# Patient Record
Sex: Female | Born: 1960 | Race: White | Hispanic: Yes
Health system: Southern US, Community
[De-identification: ages and names within clinical notes are randomized; demographics above are authoritative.]

---

## 2019-04-26 ENCOUNTER — Telehealth: Payer: Self-pay | Admitting: Hematology

## 2019-04-26 NOTE — Telephone Encounter (Signed)
Colleen Reeves has been cld and scheduled to see Dr. Irene Limbo on 2/15 at 1pm. She's aware to arrive 15 minutes early.

## 2019-05-07 ENCOUNTER — Inpatient Hospital Stay: Payer: Federal, State, Local not specified - PPO

## 2019-05-07 ENCOUNTER — Ambulatory Visit (HOSPITAL_COMMUNITY)
Admission: RE | Admit: 2019-05-07 | Discharge: 2019-05-07 | Disposition: A | Discharge status: Home / Self Care | Payer: Federal, State, Local not specified - PPO | Source: Ambulatory Visit | Attending: Hematology | Admitting: Hematology

## 2019-05-07 ENCOUNTER — Other Ambulatory Visit: Payer: Self-pay

## 2019-05-07 ENCOUNTER — Inpatient Hospital Stay: Payer: Federal, State, Local not specified - PPO | Attending: Hematology | Admitting: Hematology

## 2019-05-07 VITALS — BP 143/74 | HR 62 | Temp 97.6°F | Resp 18 | Ht 68.0 in | Wt 180.7 lb

## 2019-05-07 DIAGNOSIS — E039 Hypothyroidism, unspecified: Secondary | ICD-10-CM | POA: Insufficient documentation

## 2019-05-07 DIAGNOSIS — Z95828 Presence of other vascular implants and grafts: Secondary | ICD-10-CM

## 2019-05-07 DIAGNOSIS — C8233 Follicular lymphoma grade IIIa, intra-abdominal lymph nodes: Secondary | ICD-10-CM | POA: Diagnosis present

## 2019-05-07 DIAGNOSIS — C8290 Follicular lymphoma, unspecified, unspecified site: Secondary | ICD-10-CM | POA: Diagnosis present

## 2019-05-07 DIAGNOSIS — Z720 Tobacco use: Secondary | ICD-10-CM | POA: Diagnosis not present

## 2019-05-07 DIAGNOSIS — Z9221 Personal history of antineoplastic chemotherapy: Secondary | ICD-10-CM | POA: Diagnosis not present

## 2019-05-07 DIAGNOSIS — Z90710 Acquired absence of both cervix and uterus: Secondary | ICD-10-CM | POA: Insufficient documentation

## 2019-05-07 LAB — CMP (CANCER CENTER ONLY)
ALT: 12 U/L (ref 0–44)
AST: 13 U/L — ABNORMAL LOW (ref 15–41)
Albumin: 3.9 g/dL (ref 3.5–5.0)
Alkaline Phosphatase: 83 U/L (ref 38–126)
Anion gap: 9 (ref 5–15)
BUN: 16 mg/dL (ref 6–20)
CO2: 26 mmol/L (ref 22–32)
Calcium: 8.7 mg/dL — ABNORMAL LOW (ref 8.9–10.3)
Chloride: 108 mmol/L (ref 98–111)
Creatinine: 0.67 mg/dL (ref 0.44–1.00)
GFR, Est AFR Am: >60 mL/min (ref >60)
GFR, Estimated: >60 mL/min (ref >60)
Glucose, Bld: 96 mg/dL (ref 70–99)
Potassium: 3.7 mmol/L (ref 3.5–5.1)
Sodium: 143 mmol/L (ref 135–145)
Total Bilirubin: 0.3 mg/dL (ref 0.3–1.2)
Total Protein: 6.4 g/dL — ABNORMAL LOW (ref 6.5–8.1)

## 2019-05-07 LAB — CBC WITH DIFFERENTIAL/PLATELET
Abs Immature Granulocytes: 0.01 10*3/uL (ref 0.00–0.07)
Basophils Absolute: 0 10*3/uL (ref 0.0–0.1)
Basophils Relative: 1 %
Eosinophils Absolute: 0.1 10*3/uL (ref 0.0–0.5)
Eosinophils Relative: 2 %
HCT: 36 % (ref 36.0–46.0)
Hemoglobin: 12.2 g/dL (ref 12.0–15.0)
Immature Granulocytes: 0 %
Lymphocytes Relative: 36 %
Lymphs Abs: 1.8 10*3/uL (ref 0.7–4.0)
MCH: 32.4 pg (ref 26.0–34.0)
MCHC: 33.9 g/dL (ref 30.0–36.0)
MCV: 95.7 fL (ref 80.0–100.0)
Monocytes Absolute: 0.4 10*3/uL (ref 0.1–1.0)
Monocytes Relative: 8 %
Neutro Abs: 2.8 10*3/uL (ref 1.7–7.7)
Neutrophils Relative %: 53 %
Platelets: 165 10*3/uL (ref 150–400)
RBC: 3.76 MIL/uL — ABNORMAL LOW (ref 3.87–5.11)
RDW: 12.1 % (ref 11.5–15.5)
WBC: 5.1 10*3/uL (ref 4.0–10.5)
nRBC: 0 % (ref 0.0–0.2)

## 2019-05-07 LAB — LACTATE DEHYDROGENASE: LDH: 143 U/L (ref 98–192)

## 2019-05-07 MED ORDER — HEPARIN SOD (PORK) LOCK FLUSH 100 UNIT/ML IV SOLN
500.0000 [IU] | Freq: Once | INTRAVENOUS | Status: AC
  Administered 2019-05-07: 500 [IU] via INTRAVENOUS
  Filled 2019-05-07: qty 5

## 2019-05-07 MED ORDER — SODIUM CHLORIDE 0.9% FLUSH
10.0000 mL | Freq: Once | INTRAVENOUS | Status: AC
  Administered 2019-05-07: 10 mL via INTRAVENOUS
  Filled 2019-05-07: qty 10
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
## 2019-05-07 NOTE — Patient Instructions (Signed)

## 2019-05-07 NOTE — Patient Instructions (Signed)
Thank you for choosing [facility] to provide your oncology and hematology care.   Should you have questions after your visit to the [facility] ([facility]), please contact this office at [phone] between 8:30 AM and 4:30 PM.  Voice mails left after 4:00 PM may not be returned until the following business day.  Calls received after 4:30 PM will be answered by an off-site Nurse Triage Line.    Prescription Refills:  Please have your pharmacy contact us directly for most prescription requests.  Contact the office directly for refills of narcotics (pain medications). Allow 48-72 hours for refills.  Appointments: Please contact the [facility] scheduling department [phone] for questions regarding [facility] appointment scheduling.  Contact the schedulers with any scheduling changes so that your appointment can be rescheduled in a timely manner.   Central Scheduling for [place] [phone] - Call to schedule procedures such as PET scans, CT scans, MRI, Ultrasound, etc.  To afford each patient quality time with our providers, please arrive 30 minutes before your scheduled appointment time.  If you arrive late for your appointment, you may be asked to reschedule.  We strive to give you quality time with our providers, and arriving late affects you and other patients whose appointments are after yours. If you are a no show for multiple scheduled visits, you may be dismissed from the clinic at the providers discretion.     Resources: [facility] Social Workers [phone] for additional information on assistance programs or assistance connecting with community support programs   [place] DSS  336-641-3447: Information regarding food stamps, Medicaid, and utility assistance [place] [phone]   [place] [employer]'s shared-ride transportation service for eligible riders who have a disability that prevents them from riding the fixed route bus.   Medicare Rights Center  800-333-4114 Helps people with Medicare understand their rights and benefits, navigate the Medicare system, and secure the quality healthcare they deserve [employer] 800-227-2345 Assists patients locate various types of support and financial assistance Cancer Care: 1-800-813-HOPE (4673) Provides financial assistance, online support groups, medication/co-pay assistance.   Transportation Assistance for appointments at [facility]: Transportation Coordinator [phone]  Again, thank you for choosing [facility] for your care.       

## 2019-05-08 LAB — TSH: TSH: 0.553 u[IU]/mL (ref 0.308–3.960)

## 2019-08-01 ENCOUNTER — Telehealth: Payer: Self-pay | Admitting: *Deleted

## 2019-08-01 NOTE — Telephone Encounter (Signed)
Patient called - needs to r/s appt on August 8/16 with Dr.Kale. She will be out of town Scheduled message sent. Appts on 8/16 cancelled

## 2019-10-22 ENCOUNTER — Other Ambulatory Visit: Payer: Self-pay

## 2019-10-22 ENCOUNTER — Inpatient Hospital Stay: Payer: Federal, State, Local not specified - PPO

## 2019-10-22 ENCOUNTER — Other Ambulatory Visit: Payer: Self-pay | Admitting: Hematology

## 2019-10-22 ENCOUNTER — Inpatient Hospital Stay: Payer: Federal, State, Local not specified - PPO | Attending: Hematology

## 2019-10-22 DIAGNOSIS — Z8572 Personal history of non-Hodgkin lymphomas: Secondary | ICD-10-CM | POA: Diagnosis not present

## 2019-10-22 DIAGNOSIS — C8290 Follicular lymphoma, unspecified, unspecified site: Secondary | ICD-10-CM

## 2019-10-22 DIAGNOSIS — Z95828 Presence of other vascular implants and grafts: Secondary | ICD-10-CM

## 2019-10-22 LAB — CMP (CANCER CENTER ONLY)
ALT: 11 U/L (ref 0–44)
AST: 12 U/L — ABNORMAL LOW (ref 15–41)
Albumin: 4 g/dL (ref 3.5–5.0)
Alkaline Phosphatase: 77 U/L (ref 38–126)
Anion gap: 9 (ref 5–15)
BUN: 19 mg/dL (ref 6–20)
CO2: 23 mmol/L (ref 22–32)
Calcium: 10 mg/dL (ref 8.9–10.3)
Chloride: 109 mmol/L (ref 98–111)
Creatinine: 0.69 mg/dL (ref 0.44–1.00)
GFR, Est AFR Am: >60 mL/min (ref >60)
GFR, Estimated: >60 mL/min (ref >60)
Glucose, Bld: 95 mg/dL (ref 70–99)
Potassium: 3.3 mmol/L — ABNORMAL LOW (ref 3.5–5.1)
Sodium: 141 mmol/L (ref 135–145)
Total Bilirubin: 0.4 mg/dL (ref 0.3–1.2)
Total Protein: 6.7 g/dL (ref 6.5–8.1)

## 2019-10-22 LAB — CBC WITH DIFFERENTIAL/PLATELET
Abs Immature Granulocytes: 0.01 10*3/uL (ref 0.00–0.07)
Basophils Absolute: 0 10*3/uL (ref 0.0–0.1)
Basophils Relative: 1 %
Eosinophils Absolute: 0 10*3/uL (ref 0.0–0.5)
Eosinophils Relative: 1 %
HCT: 35.9 % — ABNORMAL LOW (ref 36.0–46.0)
Hemoglobin: 12.2 g/dL (ref 12.0–15.0)
Immature Granulocytes: 0 %
Lymphocytes Relative: 30 %
Lymphs Abs: 1.2 10*3/uL (ref 0.7–4.0)
MCH: 32 pg (ref 26.0–34.0)
MCHC: 34 g/dL (ref 30.0–36.0)
MCV: 94.2 fL (ref 80.0–100.0)
Monocytes Absolute: 0.4 10*3/uL (ref 0.1–1.0)
Monocytes Relative: 9 %
Neutro Abs: 2.4 10*3/uL (ref 1.7–7.7)
Neutrophils Relative %: 59 %
Platelets: 147 10*3/uL — ABNORMAL LOW (ref 150–400)
RBC: 3.81 MIL/uL — ABNORMAL LOW (ref 3.87–5.11)
RDW: 12 % (ref 11.5–15.5)
WBC: 4.1 10*3/uL (ref 4.0–10.5)
nRBC: 0 % (ref 0.0–0.2)

## 2019-10-22 LAB — LACTATE DEHYDROGENASE: LDH: 157 U/L (ref 98–192)

## 2019-10-22 MED ORDER — SODIUM CHLORIDE 0.9% FLUSH
10.0000 mL | INTRAVENOUS | Status: DC | PRN
  Administered 2019-10-22: 10 mL via INTRAVENOUS
  Filled 2019-10-22: qty 10

## 2019-10-22 MED ORDER — HEPARIN SOD (PORK) LOCK FLUSH 100 UNIT/ML IV SOLN
500.0000 [IU] | Freq: Once | INTRAVENOUS | Status: AC
  Administered 2019-10-22: 500 [IU] via INTRAVENOUS
  Filled 2019-10-22: qty 5

## 2019-10-26 ENCOUNTER — Telehealth: Payer: Self-pay | Admitting: Hematology

## 2019-10-26 NOTE — Telephone Encounter (Signed)
Rescheduled 08/23 to 08/31 per scheduling change, patient has been called and notified.

## 2019-11-05 ENCOUNTER — Ambulatory Visit: Payer: Federal, State, Local not specified - PPO | Admitting: Hematology

## 2019-11-12 ENCOUNTER — Ambulatory Visit: Payer: Federal, State, Local not specified - PPO | Admitting: Hematology

## 2019-11-19 ENCOUNTER — Other Ambulatory Visit: Payer: Self-pay

## 2019-11-19 ENCOUNTER — Ambulatory Visit (HOSPITAL_COMMUNITY)
Admission: RE | Admit: 2019-11-19 | Discharge: 2019-11-19 | Disposition: A | Discharge status: Home / Self Care | Payer: Federal, State, Local not specified - PPO | Source: Ambulatory Visit | Attending: Hematology | Admitting: Hematology

## 2019-11-19 ENCOUNTER — Encounter (HOSPITAL_COMMUNITY): Payer: Self-pay

## 2019-11-19 DIAGNOSIS — C8290 Follicular lymphoma, unspecified, unspecified site: Secondary | ICD-10-CM | POA: Diagnosis not present

## 2019-11-19 MED ORDER — SODIUM CHLORIDE (PF) 0.9 % IJ SOLN
INTRAMUSCULAR | Status: AC
  Filled 2019-11-19: qty 50

## 2019-11-19 MED ORDER — IOHEXOL 300 MG/ML  SOLN
100.0000 mL | Freq: Once | INTRAMUSCULAR | Status: AC | PRN
  Administered 2019-11-19: 100 mL via INTRAVENOUS

## 2019-11-19 MED ORDER — HEPARIN SOD (PORK) LOCK FLUSH 100 UNIT/ML IV SOLN
INTRAVENOUS | Status: AC
  Filled 2019-11-19: qty 5

## 2019-11-19 MED ORDER — HEPARIN SOD (PORK) LOCK FLUSH 100 UNIT/ML IV SOLN
500.0000 [IU] | Freq: Once | INTRAVENOUS | Status: AC
  Administered 2019-11-19: 500 [IU] via INTRAVENOUS
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
## 2019-11-20 ENCOUNTER — Inpatient Hospital Stay (HOSPITAL_BASED_OUTPATIENT_CLINIC_OR_DEPARTMENT_OTHER): Payer: Federal, State, Local not specified - PPO | Admitting: Hematology

## 2019-11-20 DIAGNOSIS — Z95828 Presence of other vascular implants and grafts: Secondary | ICD-10-CM | POA: Diagnosis not present

## 2019-11-20 DIAGNOSIS — C8290 Follicular lymphoma, unspecified, unspecified site: Secondary | ICD-10-CM

## 2019-11-20 DIAGNOSIS — Z8572 Personal history of non-Hodgkin lymphomas: Secondary | ICD-10-CM | POA: Diagnosis not present

## 2019-12-06 ENCOUNTER — Telehealth: Payer: Self-pay | Admitting: Hematology

## 2019-12-06 NOTE — Telephone Encounter (Signed)
Scheduled 08/31 los, patient has been called and voicemail was left.

## 2019-12-06 NOTE — Telephone Encounter (Signed)
Scheduled per 08/31 los, patient has been called and voicemail was left. 

## 2020-01-14 ENCOUNTER — Telehealth: Payer: Self-pay | Admitting: Hematology

## 2020-01-14 NOTE — Telephone Encounter (Signed)
Cancelled 10/26 per patient request in sch msg. Called to speak with patient. Not able to leave msg.  Cancelled appt

## 2020-01-15 ENCOUNTER — Inpatient Hospital Stay: Payer: Federal, State, Local not specified - PPO

## 2020-03-17 ENCOUNTER — Inpatient Hospital Stay: Payer: Federal, State, Local not specified - PPO | Attending: Internal Medicine

## 2020-05-01 ENCOUNTER — Telehealth: Payer: Self-pay | Admitting: *Deleted

## 2020-05-01 NOTE — Telephone Encounter (Signed)
-----   Message from [place] sent at 04/29/2020  7:32 AM EST ----- Regarding: RE: Records to be sent Hello,  I have reviewed ROI request however, it does not look like she completed it here at the [facility]. It looks like pt might have completed ROI request online and sent it to another medical records dept within [facility]. From what I can see it looks like that request has been completed but due to it being fulfilled at that HIM dept I wouldn't be able to provide details as to how that was sent and/or completed. Pt would have to call their office [phone] for an update on release.   Thank you ----- Message ----- From: Rolland Bimler, RN Sent: 04/28/2020   2:12 PM EST To: Cipri[facility] Bunker [facility] Subject: Records to be sent                             Good afternoon, Patient has relocated to [facility]. She completed a ROI and requested for her records to be sent to [facility] in [facility] - it was entered into her chart 04/09/20. She called the office and asked if it was known when the records would be sent.  She has an implanted port that needs to be flushed and wants to make appt with them soon. Roy is waiting until they receive the records  to make appt for her.  Can you provide an update on the records process? Or if there is a number she can call for further information? Thanks, Georgina Pillion, RN

## 2020-05-01 NOTE — Telephone Encounter (Signed)
Contacted patient with information as in message below. Gave patient number provided by HIM rep. Patient verbalized understanding.
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
## 2020-05-19 ENCOUNTER — Inpatient Hospital Stay: Payer: Federal, State, Local not specified - PPO

## 2020-05-19 ENCOUNTER — Inpatient Hospital Stay: Payer: Federal, State, Local not specified - PPO | Admitting: Hematology

## 2020-05-19 ENCOUNTER — Inpatient Hospital Stay: Payer: Federal, State, Local not specified - PPO | Attending: Internal Medicine

## 2020-07-11 ENCOUNTER — Encounter: Payer: Self-pay | Admitting: Hematology

## 2020-07-22 ENCOUNTER — Telehealth: Payer: Self-pay

## 2020-07-22 NOTE — Telephone Encounter (Signed)
Contacted for clarification of type of letter needed.

## 2020-08-30 IMAGING — CT CT CHEST W/ CM
3 of 5 series · 14 of 36 positions shown, 17 images · IV contrast (OMNIPAQUE)
Comparison: None.

CLINICAL DATA: Surveillance, evaluate for progression of follicular
lymphoma

EXAM:
CT CHEST, ABDOMEN, AND PELVIS WITH CONTRAST
TECHNIQUE: Multidetector CT imaging of the chest, abdomen and pelvis was
performed following the standard protocol during bolus
administration of intravenous contrast.
CONTRAST:  100mL OMNIPAQUE IOHEXOL 300 MG/ML SOLN, additional oral
enteric contrast

[Series 2: cap with · axial · 0.66mm/px · z∈[-600,-80]mm · 9 of 131 slices shown, 12 images]
[im 14/131  mediastinal]
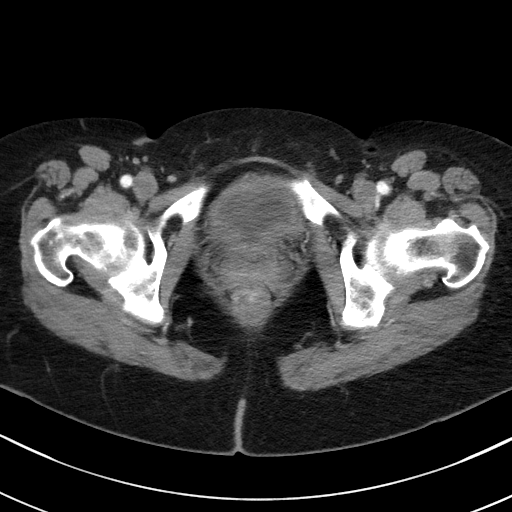
[im 14/131  lung]
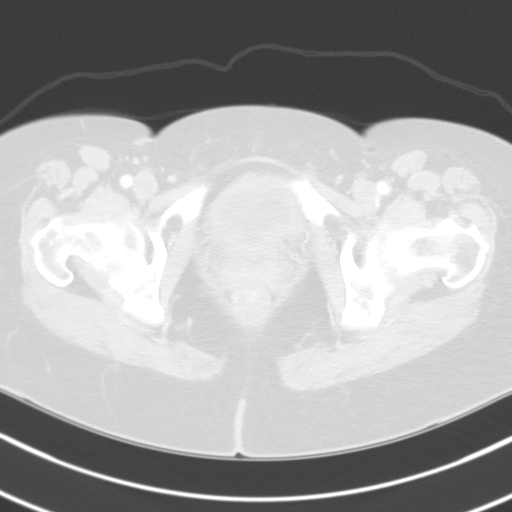
[im 27/131  lung]
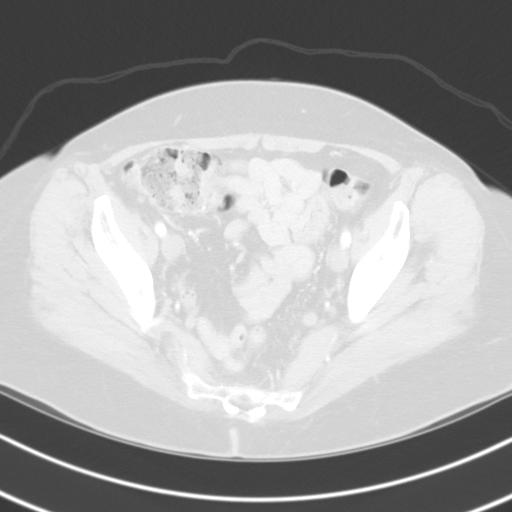
[im 40/131  lung]
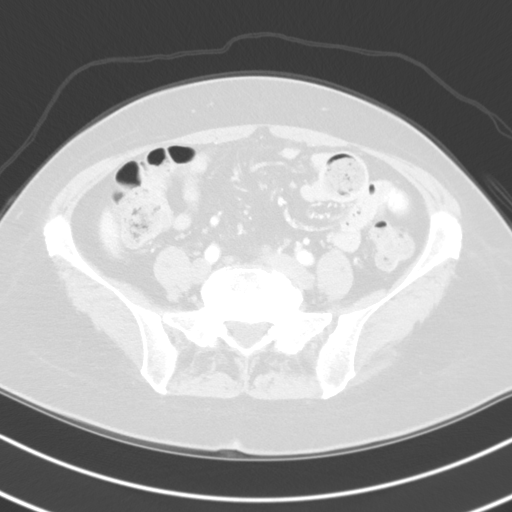
[im 53/131  lung]
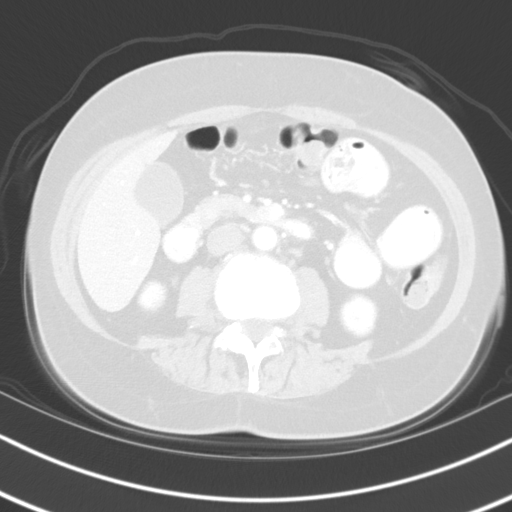
[im 66/131  mediastinal]
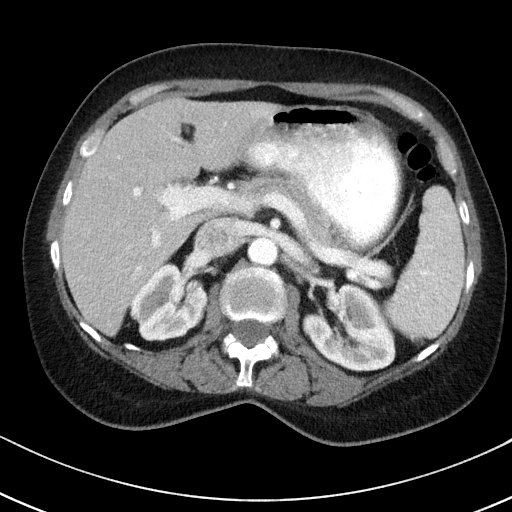
[im 66/131  lung]
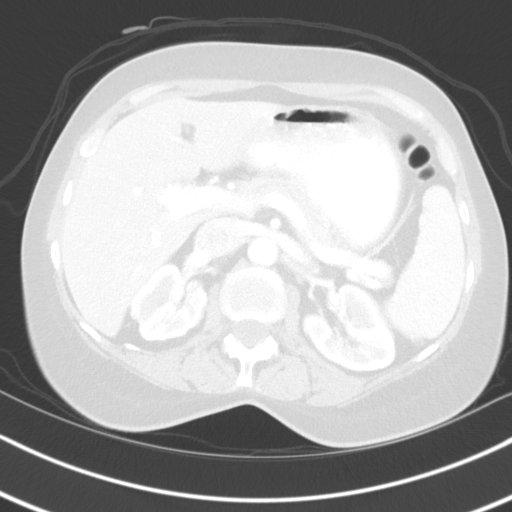
[im 79/131  lung]
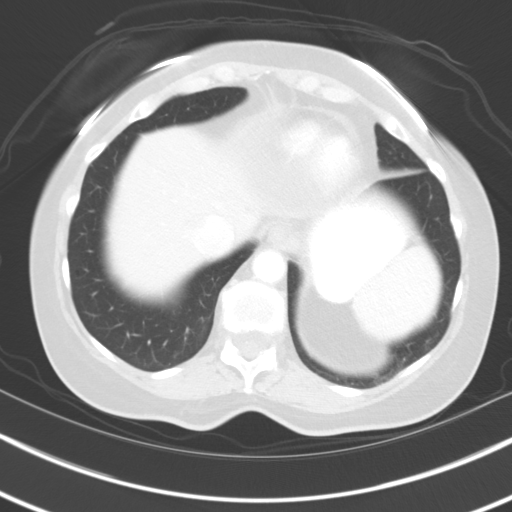
[im 92/131  lung]
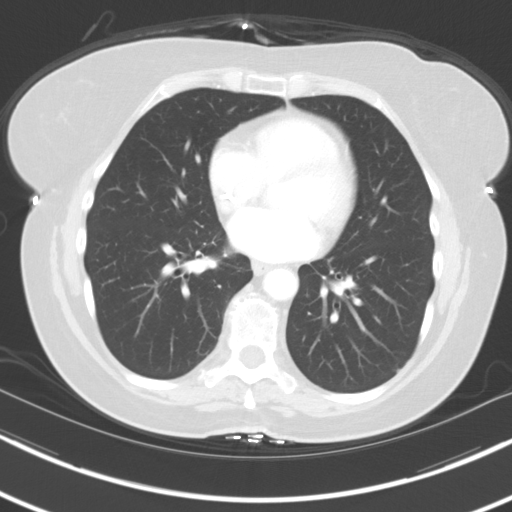
[im 105/131  lung]
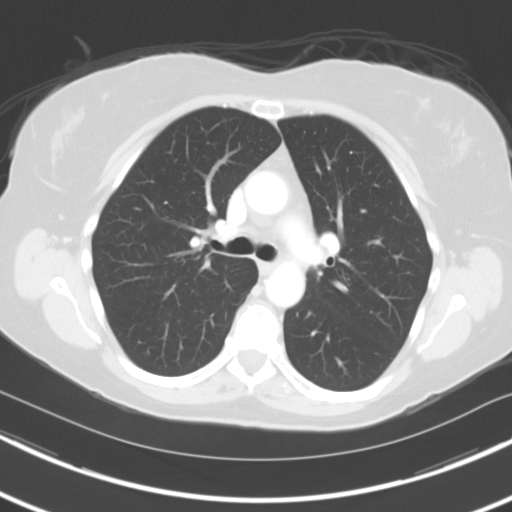
[im 118/131  mediastinal]
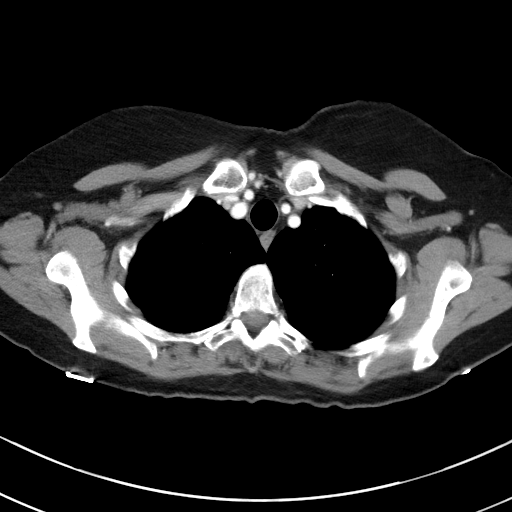
[im 118/131  lung]
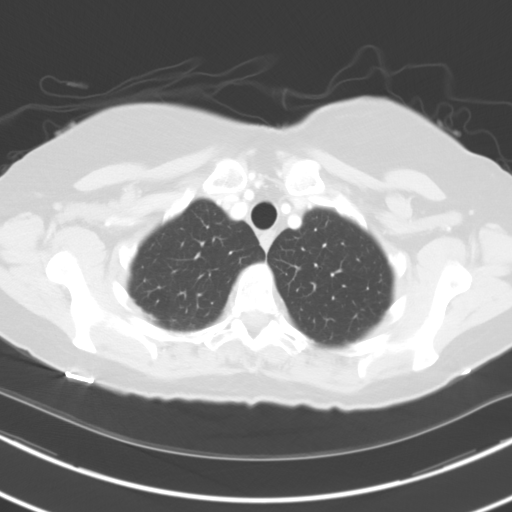

[Series 4: coronals · coronal · 0.82mm/px · 3 of 144 slices shown]
[im 29/144  lung]
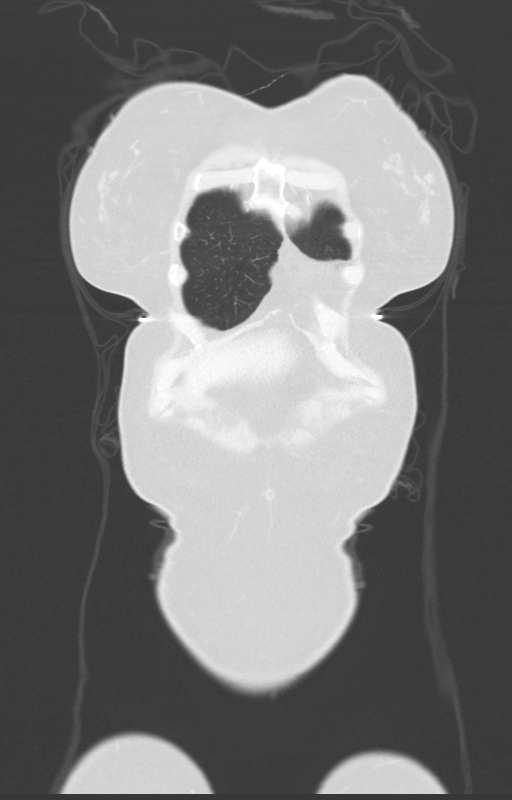
[im 58/144  lung]
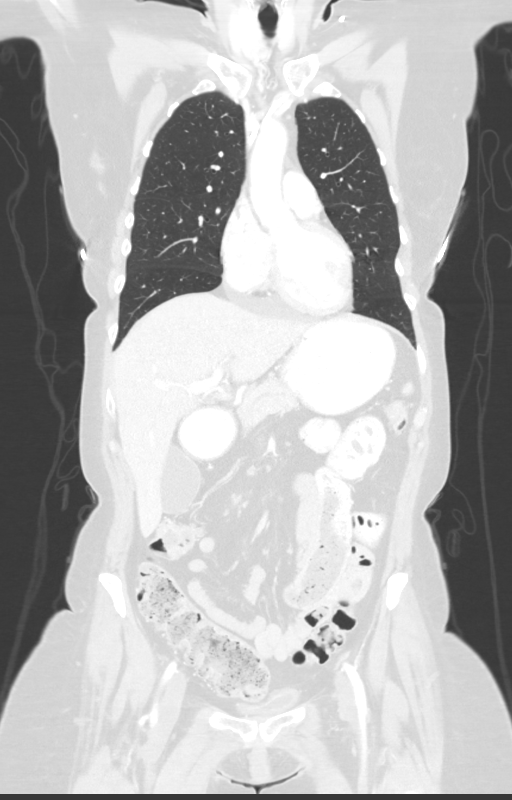
[im 86/144  lung]
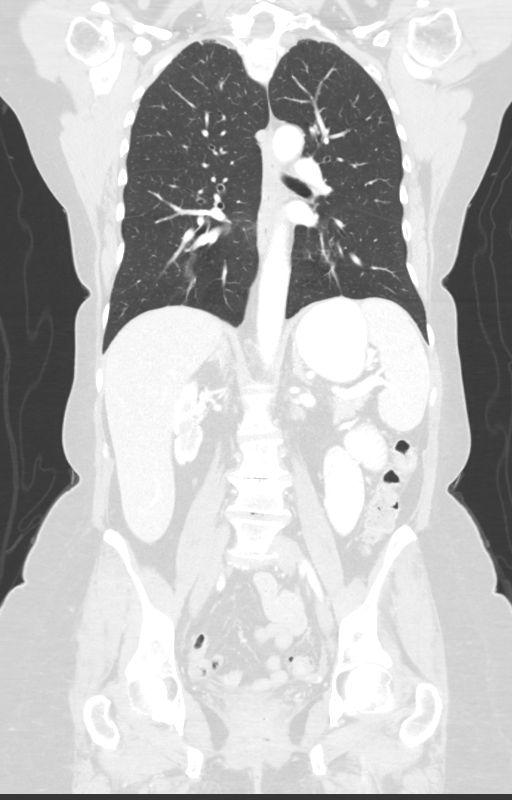

[Series 6: lung · axial · 0.66mm/px · z∈[-313,-263]mm · 2 of 162 slices shown]
[im 13/162  lung]
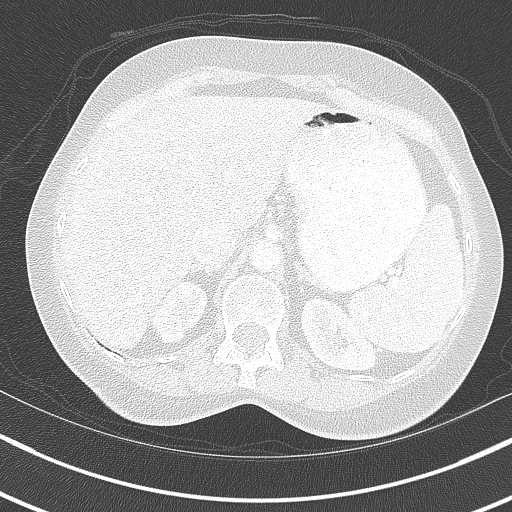
[im 38/162  lung]
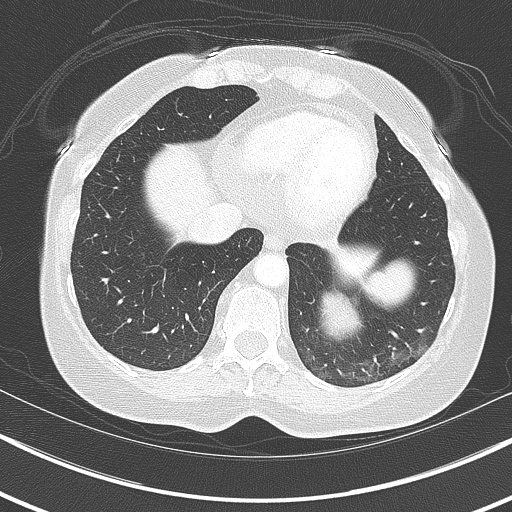

[14 of 36 positions shown; findings below may reference images not displayed]

FINDINGS: CT CHEST FINDINGS

Cardiovascular: Left chest port catheter. Normal heart size. No
pericardial effusion.

Mediastinum/Nodes: No enlarged mediastinal, hilar, or axillary lymph
nodes. Thyroid gland, trachea, and esophagus demonstrate no
significant findings.

Lungs/Pleura: Lungs are clear. No pleural effusion or pneumothorax.

Musculoskeletal: No chest wall mass or suspicious bone lesions
identified.

CT ABDOMEN PELVIS FINDINGS

Hepatobiliary: No solid liver abnormality is seen. Hepatomegaly,
maximum coronal span 22 cm. No gallstones, gallbladder wall
thickening, or biliary dilatation.

Pancreas: Unremarkable. No pancreatic ductal dilatation or
surrounding inflammatory changes.

Spleen: Normal in size without significant abnormality.

Adrenals/Urinary Tract: Adrenal glands are unremarkable. Small
nonobstructive bilateral renal calculi. No hydronephrosis. Bladder
is unremarkable.

Stomach/Bowel: Stomach is within normal limits. Appendix is not
clearly visualized and may be surgically absent. No evidence of
bowel wall thickening, distention, or inflammatory changes.

Vascular/Lymphatic: No significant vascular findings are present. No
pathologically enlarged abdominal or pelvic lymph nodes.
Subcentimeter retroperitoneal lymph nodes, some which appear faintly
stranded (series 2, image 69, 77).

Reproductive: Status post hysterectomy.

Other: No abdominal wall hernia or abnormality. No abdominopelvic
ascites.

Musculoskeletal: No acute or significant osseous findings.
IMPRESSION: 1. No pathologically enlarged lymph nodes in the chest, abdomen, or
pelvis.
2. There are subcentimeter retroperitoneal lymph nodes in the
abdomen, some which appear faintly stranded, presumably reflecting
treated sequelae of lymphoma. Comparison to prior imaging, if
available, would be helpful to assess for interval change.
3. Normal spleen.
4. Hepatomegaly.
5. Nonobstructive bilateral nephrolithiasis.

## 2024-02-20 ENCOUNTER — Inpatient Hospital Stay

## 2024-02-20 ENCOUNTER — Inpatient Hospital Stay: Attending: Hematology | Admitting: Hematology

## 2024-02-20 VITALS — BP 132/73 | HR 71 | Temp 97.5°F | Resp 20 | Wt 193.1 lb

## 2024-02-20 DIAGNOSIS — C8290 Follicular lymphoma, unspecified, unspecified site: Secondary | ICD-10-CM

## 2024-02-20 DIAGNOSIS — C829A Follicular lymphoma, unspecified, in remission: Secondary | ICD-10-CM | POA: Diagnosis present
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
## 2024-03-23 ENCOUNTER — Other Ambulatory Visit: Payer: Self-pay | Admitting: Radiology

## 2024-03-26 NOTE — H&P (Signed)
 "  Chief Complaint: Follicular lymphoma/low grade NHL, currently in clinical remission; port a cath placed in 2009; referred for port a cath removal   Referring Provider(s): Kale,G  Supervising Physician: Hughes Simmonds  Patient Status: [facility] OP  History of Present Illness: Colleen Reeves is a 64 y.o. female ex smoker with PMH sig for nephrolithiasis, hypothyroidism, gallstones, partial hysterectomy, tenosynovitis, eczema, HSV, and NHL/low grade follicular lymphoma diagnosed 2005, currently in clinical remission. She had left chest wall port a cath placed in [place]  in 2009. She is scheduled today for port a cath removal with moderate sedation. She does report some left sided neck/shoulder/arm discomfort.   Patient is Full Code    Allergies: Patient has no known allergies.  Medications: Prior to Admission medications  Medication Sig Start Date End Date Taking? Authorizing Provider  betamethasone dipropionate (DIPROLENE) 0.05 % ointment as needed. 09/01/18   [provider]  estradiol (ESTRACE) 1 MG tablet Take by mouth. 05/01/19   [provider]  levothyroxine (SYNTHROID) 50 MCG tablet Take by mouth. 02/19/19   [provider]  valACYclovir (VALTREX) 500 MG tablet as needed. When under stress. Take 2 tablets PRN. 10/05/18   [provider]     No family history on file.  Social History   Socioeconomic History   Marital status: Divorced    Spouse name: Not on file   Number of children: Not on file   Years of education: Not on file   Highest education level: Not on file  Occupational History   Not on file  Tobacco Use   Smoking status: Not on file   Smokeless tobacco: Not on file  Substance and Sexual Activity   Alcohol use: Not on file   Drug use: Not on file   Sexual activity: Not on file  Other Topics Concern   Not on file  Social History Narrative   Not on file   Social Drivers of Health   Tobacco Use: Medium Risk (01/10/2024)    [facility]   Patient History    Smoking Tobacco Use: Former    Smokeless Tobacco Use: Never    Passive Exposure: Not on file  Financial Resource Strain: Low Risk (12/21/2023)   [facility] [employer] Strain (CARDIA)    Difficulty of Paying Living Expenses: Not hard at all  Food Insecurity: No Food Insecurity (02/16/2024)   Epic    Worried About Radiation Protection Practitioner of Food in the Last Year: Never true    Ran Out of Food in the Last Year: Never true  Transportation Needs: No Transportation Needs (02/16/2024)   Epic    Lack of Transportation (Medical): No    Lack of Transportation (Non-Medical): No  Physical Activity: Insufficiently Active (12/21/2023)   [facility]   Exercise Vital Sign    On average, how many days per week do you engage in moderate to strenuous exercise (like a brisk walk)?: 1 day    On average, how many minutes do you engage in exercise at this level?: 30 min  Stress: Not on file  Social Connections: Moderately Integrated (12/21/2023)   [facility] [employer] and Isolation Panel    In a typical week, how many times do you talk on the phone with family, friends, or neighbors?: More than three times a week    How often do you get together with friends  or relatives?: More than three times a week    How often do you attend church or religious services?: More than 4 times per year    Do you belong to any clubs or organizations such as church groups, unions, fraternal or athletic groups, or school groups?: No    How often do you attend meetings of the clubs or organizations you belong to?: Never    Are you married, widowed, divorced, separated, never married, or living with a partner?: Living with partner  Depression (PHQ2-9): Not on file  Alcohol Screen: Not on file  Housing: Unknown (02/16/2024)   Epic    Unable to Pay for  Housing in the Last Year: No    Number of Times Moved in the Last Year: Not on file    Homeless in the Last Year: No  Utilities: Not At Risk (02/16/2024)   Epic    Threatened with loss of utilities: No  Health Literacy: Not on file       Review of Systems; denies fever,HA,CP,dyspnea,cough, abd/back pain,N/V or bleeding; she does report some left sided neck/shoulder/arm discomfort.  Vital Signs: temp 98.1  BP 160/86  HR 76  R 17  O2 SATS 99% RA    Advance Care Plan: no documents on file   Physical Exam: awake/alert; chest- CTA bilat; clean, intact left chest wall port a cath; heart- RRR; abd-soft,+BS,NT; no LE edema  Imaging: No results found.  Labs:  CBC: No results for input(s): WBC, HGB, HCT, PLT in the last 8760 hours.  COAGS: No results for input(s): INR, APTT in the last 8760 hours.  BMP: No results for input(s): NA, K, CL, CO2, GLUCOSE, BUN, CALCIUM, CREATININE, GFRNONAA, GFRAA in the last 8760 hours.  Invalid input(s): CMP  LIVER FUNCTION TESTS: No results for input(s): BILITOT, AST, ALT, ALKPHOS, PROT, ALBUMIN in the last 8760 hours.  TUMOR MARKERS: No results for input(s): AFPTM, CEA, CA199, CHROMGRNA in the last 8760 hours.  Assessment and Plan: 64 y.o. female ex smoker with PMH sig for nephrolithiasis, hypothyroidism, gallstones, partial hysterectomy, tenosynovitis, eczema, HSV, and NHL/low grade follicular lymphoma diagnosed 2005, currently in clinical remission. She had left chest wall port a cath placed in [place]  in 2009. She is scheduled today for port a cath removal with moderate sedation. She does report some left sided neck/shoulder/arm discomfort. Risks and benefits of image guided port-a-catheter removal  was discussed with the patient including, but not limited to bleeding, infection , injury to adjacent structures .  All of the patient's questions were answered, patient is agreeable to  proceed. Consent signed and in chart.    Thank you for allowing our service to participate in Colleen Reeves 's care.  Electronically Signed: D. Franky Rakers, PA-C   03/26/2024, 9:02 AM      I spent a total of  20 minutes   in face to face in clinical consultation, greater than 50% of which was counseling/coordinating care for port a cath removal   "

## 2024-03-27 ENCOUNTER — Ambulatory Visit (HOSPITAL_COMMUNITY)
Admission: RE | Admit: 2024-03-27 | Discharge: 2024-03-27 | Disposition: A | Discharge status: Home / Self Care | Source: Ambulatory Visit | Attending: Hematology | Admitting: Hematology

## 2024-03-27 ENCOUNTER — Encounter (HOSPITAL_COMMUNITY): Payer: Self-pay

## 2024-03-27 ENCOUNTER — Ambulatory Visit (HOSPITAL_COMMUNITY)
Admission: RE | Admit: 2024-03-27 | Discharge: 2024-03-27 | Disposition: A | Source: Ambulatory Visit | Attending: Hematology

## 2024-03-27 ENCOUNTER — Other Ambulatory Visit: Payer: Self-pay

## 2024-03-27 DIAGNOSIS — C8290 Follicular lymphoma, unspecified, unspecified site: Secondary | ICD-10-CM | POA: Insufficient documentation

## 2024-03-27 DIAGNOSIS — Z452 Encounter for adjustment and management of vascular access device: Secondary | ICD-10-CM | POA: Insufficient documentation

## 2024-03-27 HISTORY — PX: IR REMOVAL TUN ACCESS W/ PORT W/O FL MOD SED: IMG2290

## 2024-03-27 MED ORDER — LIDOCAINE-EPINEPHRINE 1 %-1:100000 IJ SOLN
20.0000 mL | Freq: Once | INTRAMUSCULAR | Status: AC
  Administered 2024-03-27: 20 mL via INTRADERMAL

## 2024-03-27 MED ORDER — FENTANYL CITRATE (PF) 100 MCG/2ML IJ SOLN
INTRAMUSCULAR | Status: AC | PRN
  Administered 2024-03-27: 50 ug via INTRAVENOUS

## 2024-03-27 MED ORDER — MIDAZOLAM HCL (PF) 2 MG/2ML IJ SOLN
INTRAMUSCULAR | Status: AC | PRN
  Administered 2024-03-27: 1 mg via INTRAVENOUS

## 2024-03-27 MED ORDER — SODIUM CHLORIDE 0.9 % IV SOLN: INTRAVENOUS | Status: DC

## 2024-03-27 MED ORDER — LIDOCAINE-EPINEPHRINE 1 %-1:100000 IJ SOLN
INTRAMUSCULAR | Status: AC
  Filled 2024-03-27: qty 20

## 2024-03-27 MED ORDER — MIDAZOLAM HCL 2 MG/2ML IJ SOLN
INTRAMUSCULAR | Status: AC
  Filled 2024-03-27: qty 2

## 2024-03-27 MED ORDER — FENTANYL CITRATE (PF) 100 MCG/2ML IJ SOLN
INTRAMUSCULAR | Status: AC
  Filled 2024-03-27: qty 2

## 2024-03-27 NOTE — Sedation Documentation (Signed)
 RN Christoffer Currier pulled 2 mg Versed  and 100 mcg Fentanyl  in IR room. Pt. Received 2 mg Versed  and 100 mcg Fentanyl  throughout the procedure.

## 2024-03-27 NOTE — Procedures (Signed)
 Vascular and Interventional Radiology Procedure Note  Patient: Colleen Reeves DOB: Sep 11, 1960 Medical Record Number: [id number] Note Date/Time: 03/27/2024 10:34 AM   Performing Physician: Thom Hall, MD Assistant(s): None  Diagnosis: Lymphoma and RX completion  Procedure: PORT REMOVAL  Anesthesia: Conscious Sedation Complications: None Estimated Blood Loss: Minimal Specimens:  None  Findings:  Successful removal of a left-sided venous port. Primary incision closure. Dermabond at skin.  See detailed procedure note with images in PACS. The patient tolerated the procedure well without incident or complication and was returned to Recovery in stable condition.    Thom Hall, MD [facility] Radiology   Pager. [phone] Clinic. [phone]

## 2025-01-21 ENCOUNTER — Inpatient Hospital Stay: Admitting: Hematology

## 2025-01-21 ENCOUNTER — Inpatient Hospital Stay
